# Patient Record
Sex: Male | Born: 1995 | Race: White | Hispanic: No | Marital: Single | Smoking: Never smoker
Health system: Southern US, Community
[De-identification: ages and names within clinical notes are randomized; demographics above are authoritative.]

---

## 2018-08-03 ENCOUNTER — Emergency Department (HOSPITAL_COMMUNITY)
Admission: EM | Admit: 2018-08-03 | Discharge: 2018-08-03 | Disposition: A | Discharge status: Home / Self Care | Payer: BC Managed Care – PPO | Attending: Emergency Medicine | Admitting: Emergency Medicine

## 2018-08-03 ENCOUNTER — Encounter (HOSPITAL_COMMUNITY): Payer: Self-pay | Admitting: Emergency Medicine

## 2018-08-03 ENCOUNTER — Other Ambulatory Visit: Payer: Self-pay

## 2018-08-03 ENCOUNTER — Emergency Department (HOSPITAL_COMMUNITY): Payer: BC Managed Care – PPO

## 2018-08-03 DIAGNOSIS — L03213 Periorbital cellulitis: Secondary | ICD-10-CM | POA: Diagnosis not present

## 2018-08-03 DIAGNOSIS — H02843 Edema of right eye, unspecified eyelid: Secondary | ICD-10-CM | POA: Diagnosis present

## 2018-08-03 LAB — COMPREHENSIVE METABOLIC PANEL
ALT: 19 U/L (ref 0–44)
AST: 22 U/L (ref 15–41)
Albumin: 4.2 g/dL (ref 3.5–5.0)
Alkaline Phosphatase: 59 U/L (ref 38–126)
Anion gap: 8 (ref 5–15)
BUN: 14 mg/dL (ref 6–20)
CO2: 23 mmol/L (ref 22–32)
Calcium: 8.9 mg/dL (ref 8.9–10.3)
Chloride: 107 mmol/L (ref 98–111)
Creatinine, Ser: 0.75 mg/dL (ref 0.61–1.24)
GFR calc Af Amer: >60 mL/min (ref >60)
GFR calc non Af Amer: >60 mL/min (ref >60)
Glucose, Bld: 93 mg/dL (ref 70–99)
Potassium: 4.5 mmol/L (ref 3.5–5.1)
Sodium: 138 mmol/L (ref 135–145)
Total Bilirubin: 0.7 mg/dL (ref 0.3–1.2)
Total Protein: 6.7 g/dL (ref 6.5–8.1)

## 2018-08-03 LAB — CBC WITH DIFFERENTIAL/PLATELET
Abs Immature Granulocytes: 0.01 10*3/uL (ref 0.00–0.07)
Basophils Absolute: 0 10*3/uL (ref 0.0–0.1)
Basophils Relative: 0 %
Eosinophils Absolute: 0.5 10*3/uL (ref 0.0–0.5)
Eosinophils Relative: 9 %
HCT: 45.9 % (ref 39.0–52.0)
Hemoglobin: 15.2 g/dL (ref 13.0–17.0)
Immature Granulocytes: 0 %
Lymphocytes Relative: 37 %
Lymphs Abs: 2.1 10*3/uL (ref 0.7–4.0)
MCH: 28.3 pg (ref 26.0–34.0)
MCHC: 33.1 g/dL (ref 30.0–36.0)
MCV: 85.5 fL (ref 80.0–100.0)
Monocytes Absolute: 0.4 10*3/uL (ref 0.1–1.0)
Monocytes Relative: 8 %
Neutro Abs: 2.7 10*3/uL (ref 1.7–7.7)
Neutrophils Relative %: 46 %
Platelets: 259 10*3/uL (ref 150–400)
RBC: 5.37 MIL/uL (ref 4.22–5.81)
RDW: 12.4 % (ref 11.5–15.5)
WBC: 5.8 10*3/uL (ref 4.0–10.5)
nRBC: 0 % (ref 0.0–0.2)

## 2018-08-03 MED ORDER — FLUORESCEIN SODIUM 1 MG OP STRP
1.0000 | ORAL_STRIP | Freq: Once | OPHTHALMIC | Status: AC
  Administered 2018-08-03: 1 via OPHTHALMIC
  Filled 2018-08-03: qty 1

## 2018-08-03 MED ORDER — CEPHALEXIN 500 MG PO CAPS: 500.0000 mg | ORAL_CAPSULE | Freq: Two times a day (BID) | ORAL | 0 refills | Status: AC

## 2018-08-03 MED ORDER — TETRACAINE HCL 0.5 % OP SOLN
1.0000 [drp] | Freq: Once | OPHTHALMIC | Status: AC
  Administered 2018-08-03: 1 [drp] via OPHTHALMIC
  Filled 2018-08-03: qty 4

## 2018-08-03 MED ORDER — IOHEXOL 300 MG/ML  SOLN
75.0000 mL | Freq: Once | INTRAMUSCULAR | Status: AC | PRN
  Administered 2018-08-03: 75 mL via INTRAVENOUS

## 2018-08-03 MED ORDER — SULFAMETHOXAZOLE-TRIMETHOPRIM 800-160 MG PO TABS: 1.0000 | ORAL_TABLET | Freq: Two times a day (BID) | ORAL | 0 refills | Status: AC

## 2018-08-03 NOTE — Discharge Instructions (Signed)
Your CT showed periorbital celulitis. I have prescribed antibiotics to help with your infection, these are 2 different classes please take them as prescribed.  If you experience any worsening symptoms, worsening swelling, decrease in vision please return to the emergency department.  Follow-up with your primary care physician in the upcoming week for reevaluation.

## 2018-08-03 NOTE — ED Notes (Signed)
Patient transported to CT 

## 2018-08-03 NOTE — ED Provider Notes (Signed)
[facility] EMERGENCY DEPARTMENT Provider Note   CSN: [id number] Arrival date & time: 08/03/18  6301    History   Chief Complaint No chief complaint on file.   HPI Jim Mills is a 23 y.o. male.     22 y.o male with no PMH presents to the ED with a chief complaint of right eye swelling x 2 days.  Patient reports he noted itching to his right eye on  Wednesday, proceeded to scratch his eye and by Friday his eye had swelling significantly.he has taken Aleve to help with his symptoms without improvement.  Patient reports having a difficulty opening his right eye, some decrease in vision along with some photophobia.  He denies any drainage from his right eye.         Home Medications    Prior to Admission medications   Medication Sig Start Date End Date Taking? Authorizing Provider  cephALEXin (KEFLEX) 500 MG capsule Take 1 capsule (500 mg total) by mouth 2 (two) times daily for 7 days. 08/03/18 08/10/18  Janeece Fitting, PA-C  sulfamethoxazole-trimethoprim (BACTRIM DS) 800-160 MG tablet Take 1 tablet by mouth 2 (two) times daily for 7 days. 08/03/18 08/10/18  Janeece Fitting, PA-C    Family History No family history on file.  Social History Social History   Tobacco Use   Smoking status: Not on file  Substance Use Topics   Alcohol use: Not on file   Drug use: Not on file     Allergies   Patient has no allergy information on record.   Review of Systems Review of Systems  Constitutional: Negative for fever.  Eyes: Positive for pain and itching. Negative for photophobia, discharge and redness.     Physical Exam Updated Vital Signs BP 127/70 (BP Location: Right Arm)    Pulse 68    Temp 98.1 F (36.7 C) (Oral)    Resp 18    Ht 6' (1.829 m)    Wt 95.3 kg    SpO2 96%    BMI 28.48 kg/m   Physical Exam Vitals signs and nursing note reviewed.  Constitutional:      Appearance: He is well-developed.  HENT:     Head: Normocephalic and atraumatic.    Eyes:     General: No scleral icterus.    Pupils: Pupils are equal, round, and reactive to light. Pupils are equal.     Right eye: Pupil is round. No corneal abrasion or fluorescein uptake.     Left eye: Pupil is round. No corneal abrasion or fluorescein uptake.     Slit lamp exam:    Right eye: Photophobia present. No corneal flare, corneal ulcer, foreign body or hyphema.     Left eye: No corneal flare, corneal ulcer, foreign body or hyphema.     Comments: Swelling noted to right eye, patient unable to open her right eye.  Please see photos attached.  Fluorescein staining revealed no dendritic lesions to the right eye.  Neck:     Musculoskeletal: Normal range of motion.  Cardiovascular:     Heart sounds: Normal heart sounds.  Pulmonary:     Effort: Pulmonary effort is normal.     Breath sounds: Normal breath sounds. No wheezing.  Chest:     Chest wall: No tenderness.  Abdominal:     General: Bowel sounds are normal. There is no distension.     Palpations: Abdomen is soft.     Tenderness: There is no abdominal tenderness.  Musculoskeletal:  General: No tenderness or deformity.  Skin:    General: Skin is warm and dry.  Neurological:     Mental Status: He is alert and oriented to person, place, and time.           Visual Acuity  Right Eye Distance: 10/12.5 Left Eye Distance: 10/10 Bilateral Distance: 10/12.5  Right Eye Near:   Left Eye Near:    Bilateral Near:     ED Treatments / Results  Labs (all labs ordered are listed, but only abnormal results are displayed) Labs Reviewed  CBC WITH DIFFERENTIAL/PLATELET  COMPREHENSIVE METABOLIC PANEL    EKG None  Radiology Ct Maxillofacial W Contrast  Result Date: 08/03/2018 CLINICAL DATA:  Cellulitis right orbit.  Soft tissue swelling EXAM: CT MAXILLOFACIAL WITH CONTRAST TECHNIQUE: Multidetector CT imaging of the maxillofacial structures was performed with intravenous contrast. Multiplanar CT image  reconstructions were also generated. CONTRAST:  75mL OMNIPAQUE IOHEXOL 300 MG/ML  SOLN COMPARISON:  None. FINDINGS: Osseous: Negative for fracture or mass.  No bony destruction. Orbits: Mild periorbital swelling on the right. No soft tissue abscess. No orbital edema or mass. Normal left orbit. Sinuses: Mild mucosal edema throughout the paranasal sinuses without air-fluid level. Soft tissues: Mild right periorbital swelling otherwise normal soft tissues. Limited intracranial: Negative IMPRESSION: Mild right periorbital cellulitis without soft tissue abscess. No edema within the orbit. Mild mucosal edema paranasal sinuses. Electronically Signed   By: Marlan Palauharles  Clark M.D.   On: 08/03/2018 10:55    Procedures Procedures (including critical care time)  Medications Ordered in ED Medications  tetracaine (PONTOCAINE) 0.5 % ophthalmic solution 1 drop (has no administration in time range)  fluorescein ophthalmic strip 1 strip (has no administration in time range)  iohexol (OMNIPAQUE) 300 MG/ML solution 75 mL (75 mLs Intravenous Contrast Given 08/03/18 1016)     Initial Impression / Assessment and Plan / ED Course  I have reviewed the triage vital signs and the nursing notes.  Pertinent labs & imaging results that were available during my care of the patient were reviewed by me and considered in my medical decision making (see chart for details).      Patient with no past medical history presents to the ED with complaints of right eye swelling which began on Wednesday after severe itching to his right eye.  Reports worsening swelling to the right eye since Wednesday.  Denies any insect bite, fevers, drainage from his eye.  During initial evaluation patient is well-appearing, significant swelling noted to the right eye, patient unable to open his right eye due to swelling.  Will obtain visual acuity along with fluorescein exam.  Some suspicion for periorbital cellulitis versus orbital cellulitis will obtain  CT imaging to further evaluate.  CMP showed no electrolyte abnormality, creatinine level is within normal limits.  CBC showed no leukocytosis, hemoglobin is unremarkable. CT maxillofacial showed: Mild right periorbital cellulitis without soft tissue abscess. No  edema within the orbit.    Mild mucosal edema paranasal sinuses.     Fluorescein evaluation of the right eye revealed no fluorescein uptake, no dendritic lesions low suspicion for any herpes ophthalmicus.  Tono-Pen pressure at 20 within normal limits compared to the left.  Patient will be discharged home with 2 sets of antibiotic therapy Bactrim along with Keflex.  He is to return for reevaluation if symptoms worsen, otherwise follow-up with PCP.  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  Return precautions provided  at length. I have discussed this patient with my attending Dr. Rodena MedinMessick who has not seen the patient but agrees with management at this time.   Portions of this note were generated with Scientist, clinical (histocompatibility and immunogenetics)Dragon dictation software. Dictation errors may occur despite best attempts at proofreading.    Final Clinical Impressions(s) / ED Diagnoses   Final diagnoses:  Periorbital cellulitis of right eye    ED Discharge Orders         Ordered    sulfamethoxazole-trimethoprim (BACTRIM DS) 800-160 MG tablet  2 times daily     08/03/18 1124    cephALEXin (KEFLEX) 500 MG capsule  2 times daily     08/03/18 1124           Claude MangesSoto, Shikira Folino, PA-C 08/03/18 1128    Wynetta FinesMessick, Peter C, MD 08/04/18 [phone]

## 2018-08-03 NOTE — ED Triage Notes (Signed)
Pt states his eye became itchy Wednesday. Pt rubbed it and on Friday it became to swell. Woke up this am and was unable to open eye. Denies drainage.

## 2018-09-19 ENCOUNTER — Emergency Department (HOSPITAL_COMMUNITY)
Admission: EM | Admit: 2018-09-19 | Discharge: 2018-09-19 | Disposition: A | Discharge status: Home / Self Care | Payer: BC Managed Care – PPO | Attending: Emergency Medicine | Admitting: Emergency Medicine

## 2018-09-19 ENCOUNTER — Other Ambulatory Visit: Payer: Self-pay

## 2018-09-19 DIAGNOSIS — R21 Rash and other nonspecific skin eruption: Secondary | ICD-10-CM | POA: Insufficient documentation

## 2018-09-19 DIAGNOSIS — Z5321 Procedure and treatment not carried out due to patient leaving prior to being seen by health care provider: Secondary | ICD-10-CM | POA: Insufficient documentation

## 2018-09-19 NOTE — ED Triage Notes (Signed)
Per pt he has been having itching and swelling that started yesterday. Today he woke up swelling to both eyes and red. Pt said happen a year ago and was given something and went away. Pt has no problem swallowing or difficulty breathing

## 2019-10-01 ENCOUNTER — Other Ambulatory Visit: Payer: Self-pay

## 2019-10-01 ENCOUNTER — Emergency Department (HOSPITAL_COMMUNITY): Payer: BC Managed Care – PPO

## 2019-10-01 ENCOUNTER — Encounter (HOSPITAL_COMMUNITY): Payer: Self-pay | Admitting: Emergency Medicine

## 2019-10-01 ENCOUNTER — Emergency Department (HOSPITAL_COMMUNITY)
Admission: EM | Admit: 2019-10-01 | Discharge: 2019-10-01 | Disposition: A | Discharge status: Home / Self Care | Payer: BC Managed Care – PPO | Attending: Emergency Medicine | Admitting: Emergency Medicine

## 2019-10-01 DIAGNOSIS — X58XXXA Exposure to other specified factors, initial encounter: Secondary | ICD-10-CM | POA: Insufficient documentation

## 2019-10-01 DIAGNOSIS — Y939 Activity, unspecified: Secondary | ICD-10-CM | POA: Diagnosis not present

## 2019-10-01 DIAGNOSIS — S60031A Contusion of right middle finger without damage to nail, initial encounter: Secondary | ICD-10-CM | POA: Insufficient documentation

## 2019-10-01 DIAGNOSIS — S60949A Unspecified superficial injury of unspecified finger, initial encounter: Secondary | ICD-10-CM | POA: Diagnosis present

## 2019-10-01 DIAGNOSIS — Y999 Unspecified external cause status: Secondary | ICD-10-CM | POA: Diagnosis not present

## 2019-10-01 DIAGNOSIS — Y929 Unspecified place or not applicable: Secondary | ICD-10-CM | POA: Diagnosis not present

## 2019-10-01 NOTE — ED Triage Notes (Signed)
Patient states he accidentally shut his right middle finger in his dresser drawer an hour ago.

## 2019-10-01 NOTE — Discharge Instructions (Signed)
Recommend ibuprofen, cool compresses to the injured finger. Follow up with your doctor or any Urgent Care if symptoms worsen.

## 2019-10-01 NOTE — ED Provider Notes (Signed)
[facility]-EMERGENCY DEPT Provider Note   CSN: [id number] Arrival date & time: 10/01/19  0017     History Chief Complaint  Patient presents with   Finger Injury    Jim Mills is a 24 y.o. male.  Patient caught his right hand in a closing drawer last evening (09/30/19) and presents for evaluation of swelling and pain to right middle finger. No other injury.   The history is provided by the patient. No language interpreter was used.       History reviewed. No pertinent past medical history.  There are no problems to display for this patient.   History reviewed. No pertinent surgical history.     History reviewed. No pertinent family history.  Social History   Tobacco Use   Smoking status: Never Smoker   Smokeless tobacco: Never Used  Vaping Use   Vaping Use: Never used  Substance Use Topics   Alcohol use: Never   Drug use: Never    Home Medications Prior to Admission medications   Not on File    Allergies    Patient has no known allergies.  Review of Systems   Review of Systems  Musculoskeletal:       See HPI.  Skin: Negative for color change and wound.  Neurological: Negative for numbness.    Physical Exam Updated Vital Signs BP (!) 145/77 (BP Location: Right Arm)    Pulse 68    Temp 98 F (36.7 C) (Oral)    Resp 15    Ht 6' (1.829 m)    Wt 97.5 kg    SpO2 100%    BMI 29.16 kg/m   Physical Exam Constitutional:      Appearance: He is well-developed.  Pulmonary:     Effort: Pulmonary effort is normal.  Musculoskeletal:        General: Normal range of motion.     Cervical back: Normal range of motion.     Comments: Right hand without swelling, bony deformity or discoloration. FROM with full strength of all joints of 3rd finger.   Skin:    General: Skin is warm and dry.  Neurological:     Mental Status: He is alert and oriented to person, place, and time.     ED Results / Procedures / Treatments   Labs (all  labs ordered are listed, but only abnormal results are displayed) Labs Reviewed - No data to display  EKG None  Radiology DG Finger Middle Right  Result Date: 10/01/2019 CLINICAL DATA:  Middle finger pain after injury. Shut finger in drawer this evening. Pain and swelling. Bruising. EXAM: RIGHT MIDDLE FINGER 2+V COMPARISON:  None. FINDINGS: There is no evidence of fracture or dislocation. Normal alignment and joint spaces. No periosteal reaction or focal bone abnormality. There is mild generalized soft tissue edema. No soft tissue air or radiopaque foreign body. IMPRESSION: Mild generalized soft tissue edema. No fracture or dislocation. Electronically Signed   By: Narda Rutherford M.D.   On: 10/01/2019 00:47    Procedures Procedures (including critical care time)  Medications Ordered in ED Medications - No data to display  ED Course  I have reviewed the triage vital signs and the nursing notes.  Pertinent labs & imaging results that were available during my care of the patient were reviewed by me and considered in my medical decision making (see chart for details).    MDM Rules/Calculators/A&P  Right 3rd finger injury caused by closing drawer. Imaging negative. No tendon deficits on exam.   Final Clinical Impression(s) / ED Diagnoses Final diagnoses:  None   1. Right 3rd finger contusion.  Rx / DC Orders ED Discharge Orders    None       Elpidio Anis, PA-C 10/01/19 0424    Shon Baton, MD 10/01/19 [phone]

## 2019-12-02 IMAGING — CT CT MAXILLOFACIAL WITH CONTRAST
3 series · 16 of 47 positions shown, 19 images · IV contrast (Omni 300)
Comparison: None.

CLINICAL DATA: Cellulitis right orbit.  Soft tissue swelling

EXAM:
CT MAXILLOFACIAL WITH CONTRAST
TECHNIQUE: Multidetector CT imaging of the maxillofacial structures was
performed with intravenous contrast. Multiplanar CT image
reconstructions were also generated.
CONTRAST:  75mL OMNIPAQUE IOHEXOL 300 MG/ML  SOLN

[Series 3: facialbone 2.0 st · axial · 0.31mm/px · z∈[-156,-4]mm · 10 of 90 slices shown, 13 images]
[im 7/90  brain]
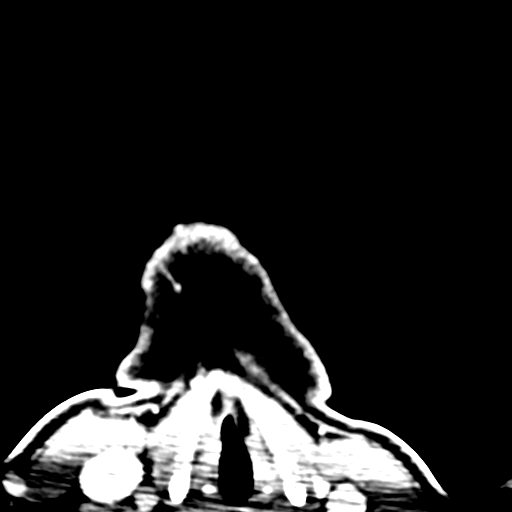
[im 7/90  bone]
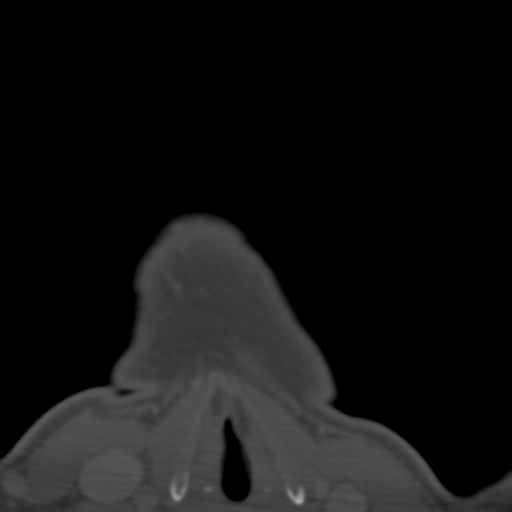
[im 16/90  bone]
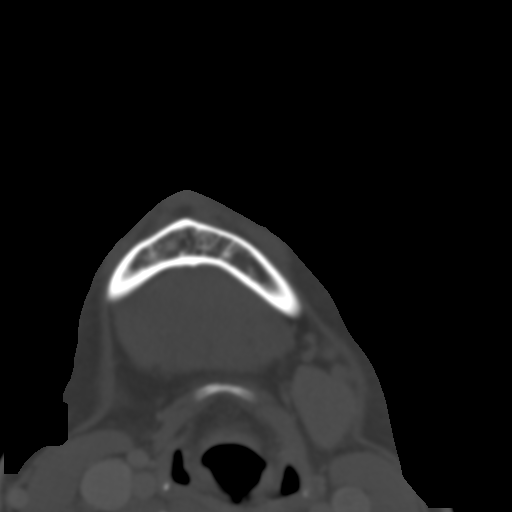
[im 25/90  bone]
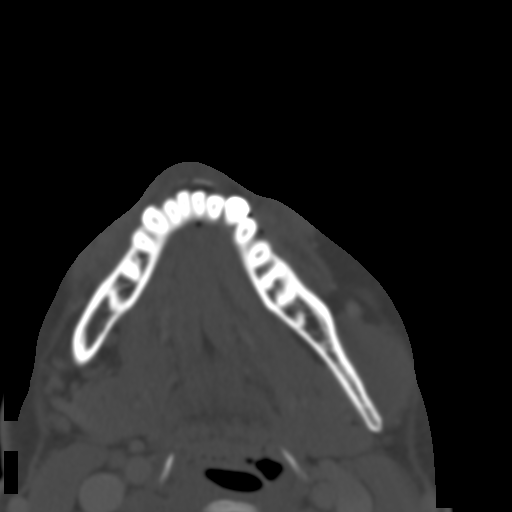
[im 31/90  bone]
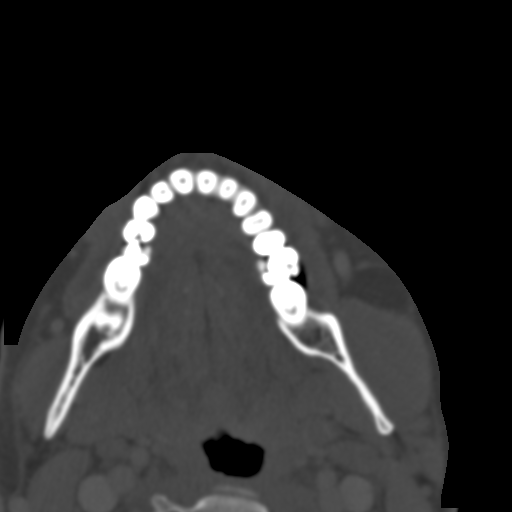
[im 40/90  brain]
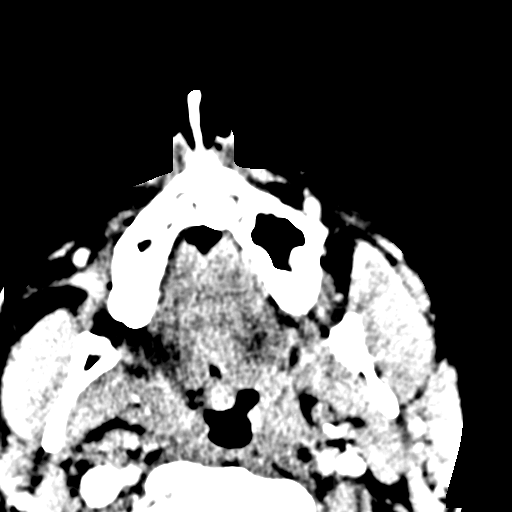
[im 40/90  bone]
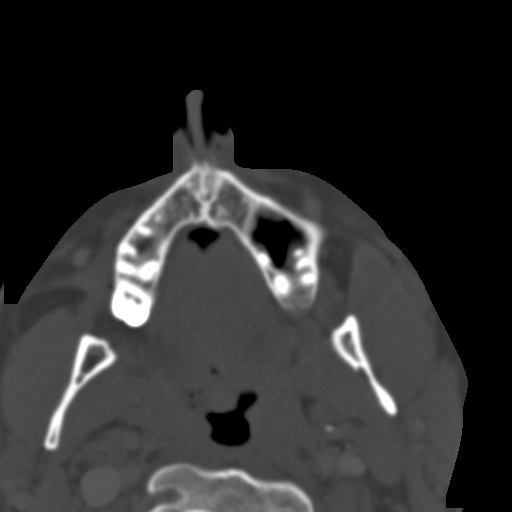
[im 50/90  bone]
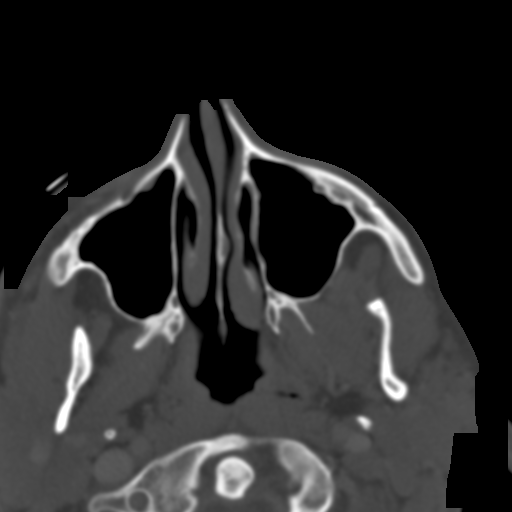
[im 59/90  bone]
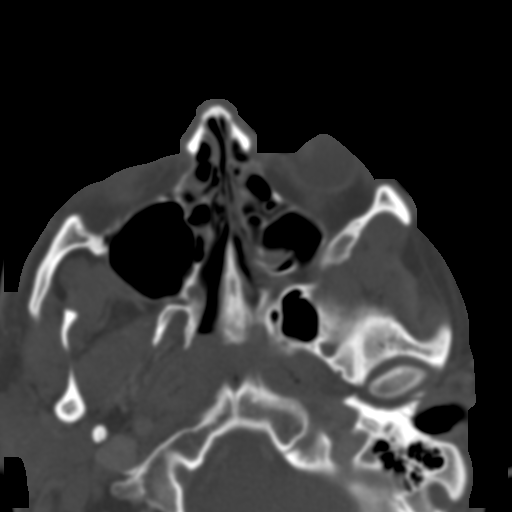
[im 68/90  bone]
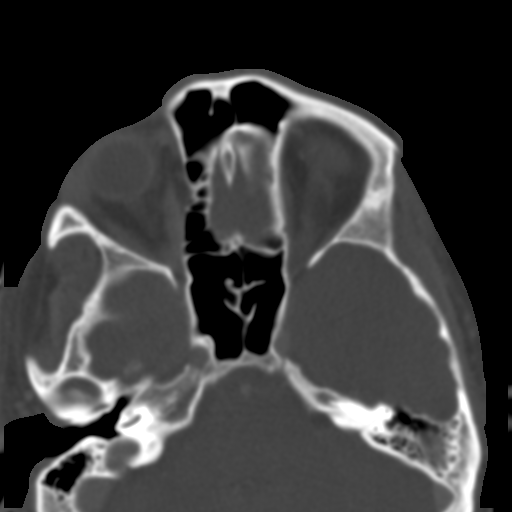
[im 74/90  brain]
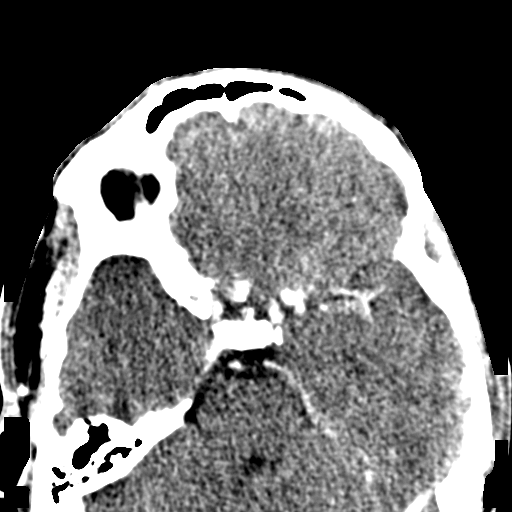
[im 74/90  bone]
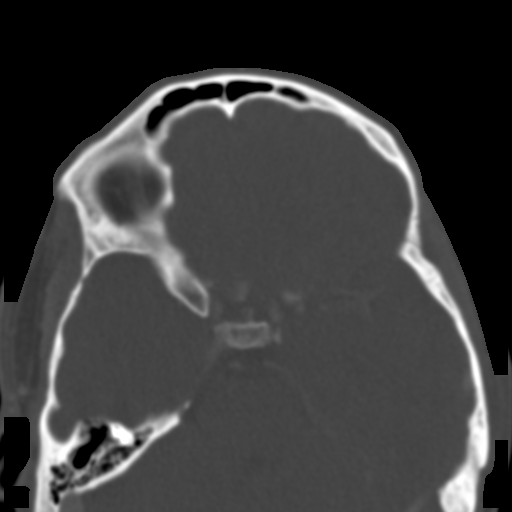
[im 83/90  bone]
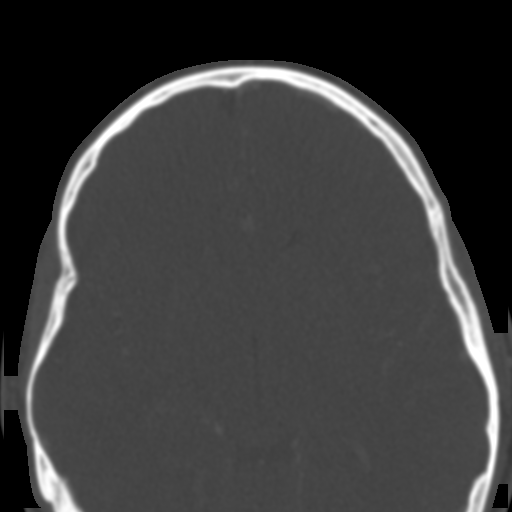

[Series 7: facialbone 2.0 cor st · coronal · 0.35mm/px · 3 of 76 slices shown]
[im 26/76  bone]
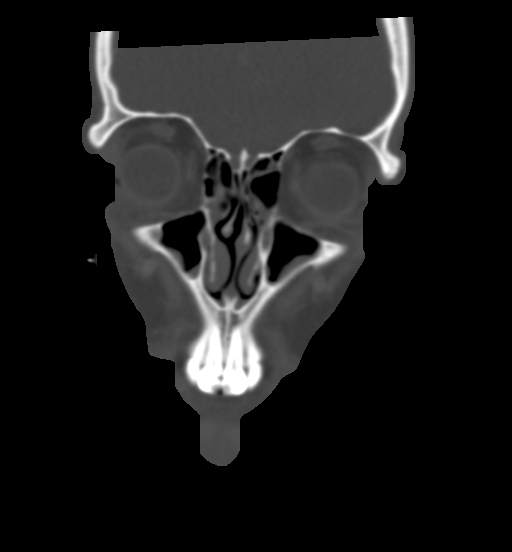
[im 34/76  bone]
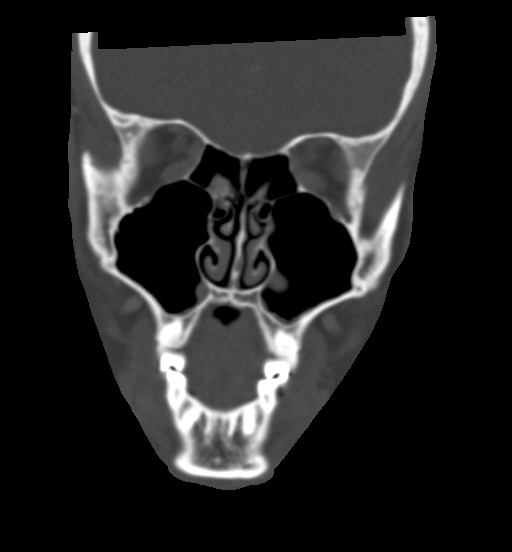
[im 42/76  bone]
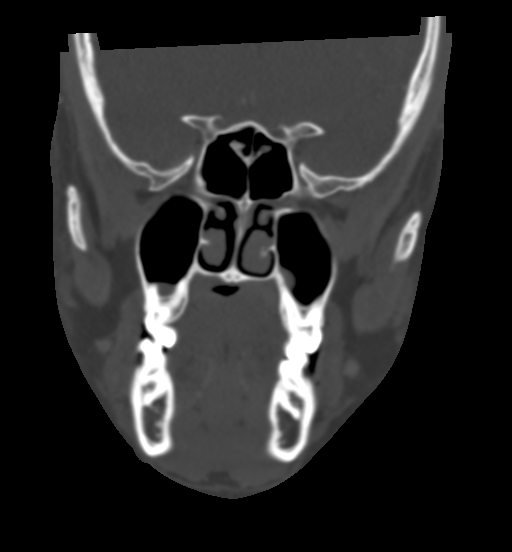

[Series 8: facialbone 2.0 sag st · sagittal · 0.35mm/px · 3 of 98 slices shown]
[im 33/98  bone]
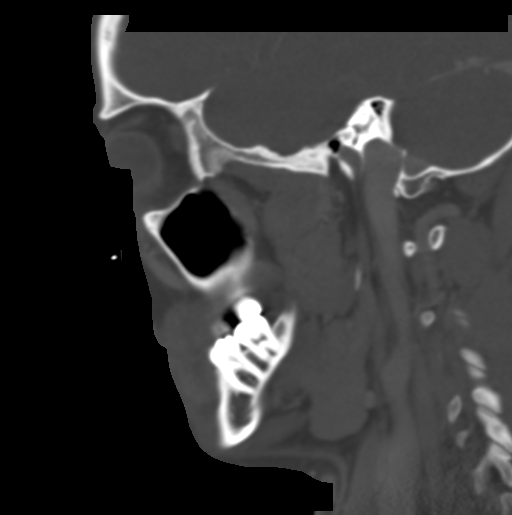
[im 49/98  bone]
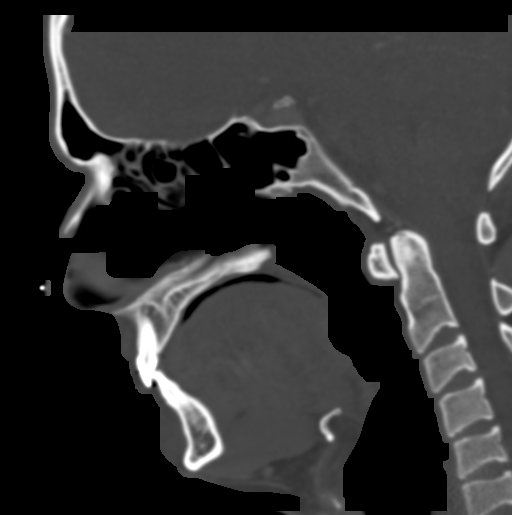
[im 65/98  bone]
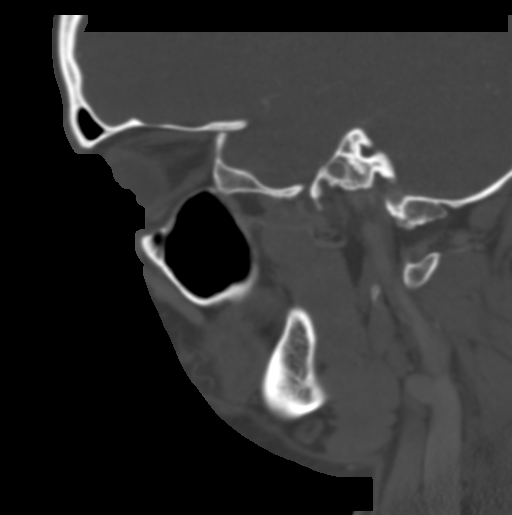

[16 of 47 positions shown; findings below may reference images not displayed]

FINDINGS: Osseous: Negative for fracture or mass.  No bony destruction.

Orbits: Mild periorbital swelling on the right. No soft tissue
abscess. No orbital edema or mass. Normal left orbit.

Sinuses: Mild mucosal edema throughout the paranasal sinuses without
air-fluid level.

Soft tissues: Mild right periorbital swelling otherwise normal soft
tissues.

Limited intracranial: Negative
IMPRESSION: Mild right periorbital cellulitis without soft tissue abscess. No
edema within the orbit.

Mild mucosal edema paranasal sinuses.

## 2021-01-29 IMAGING — CR DG FINGER MIDDLE 2+V*R*
3 series · 3 of 3 positions shown · non-contrast
Comparison: None.

CLINICAL DATA: Middle finger pain after injury. Shut finger in
drawer this evening. Pain and swelling. Bruising.

EXAM:
RIGHT MIDDLE FINGER 2+V

[x finger pa right]
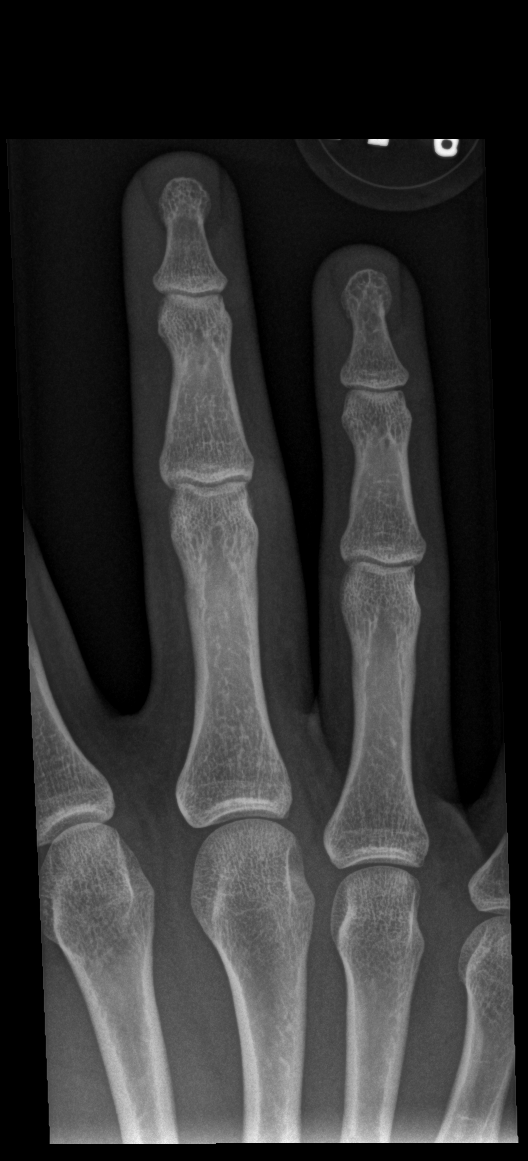

[x finger obl right]
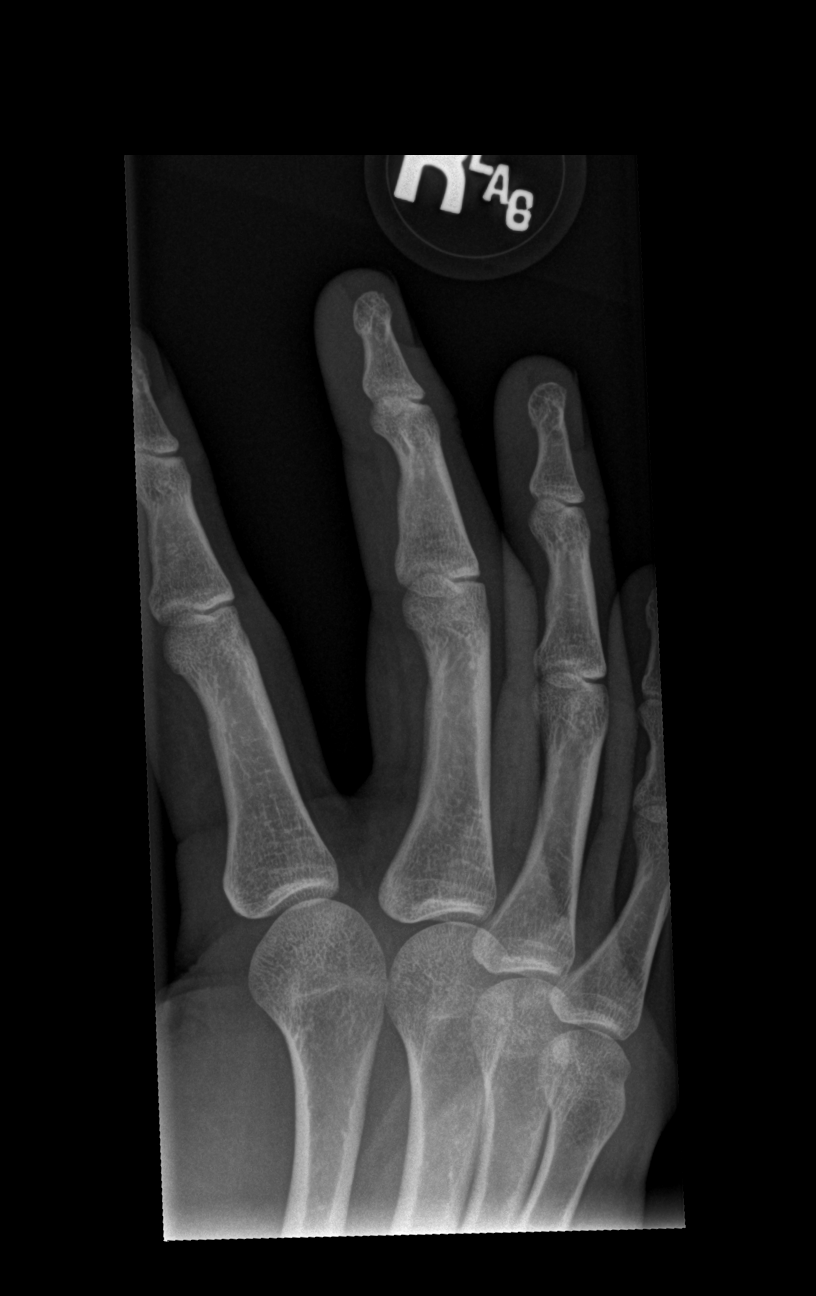

[x finger lat right]
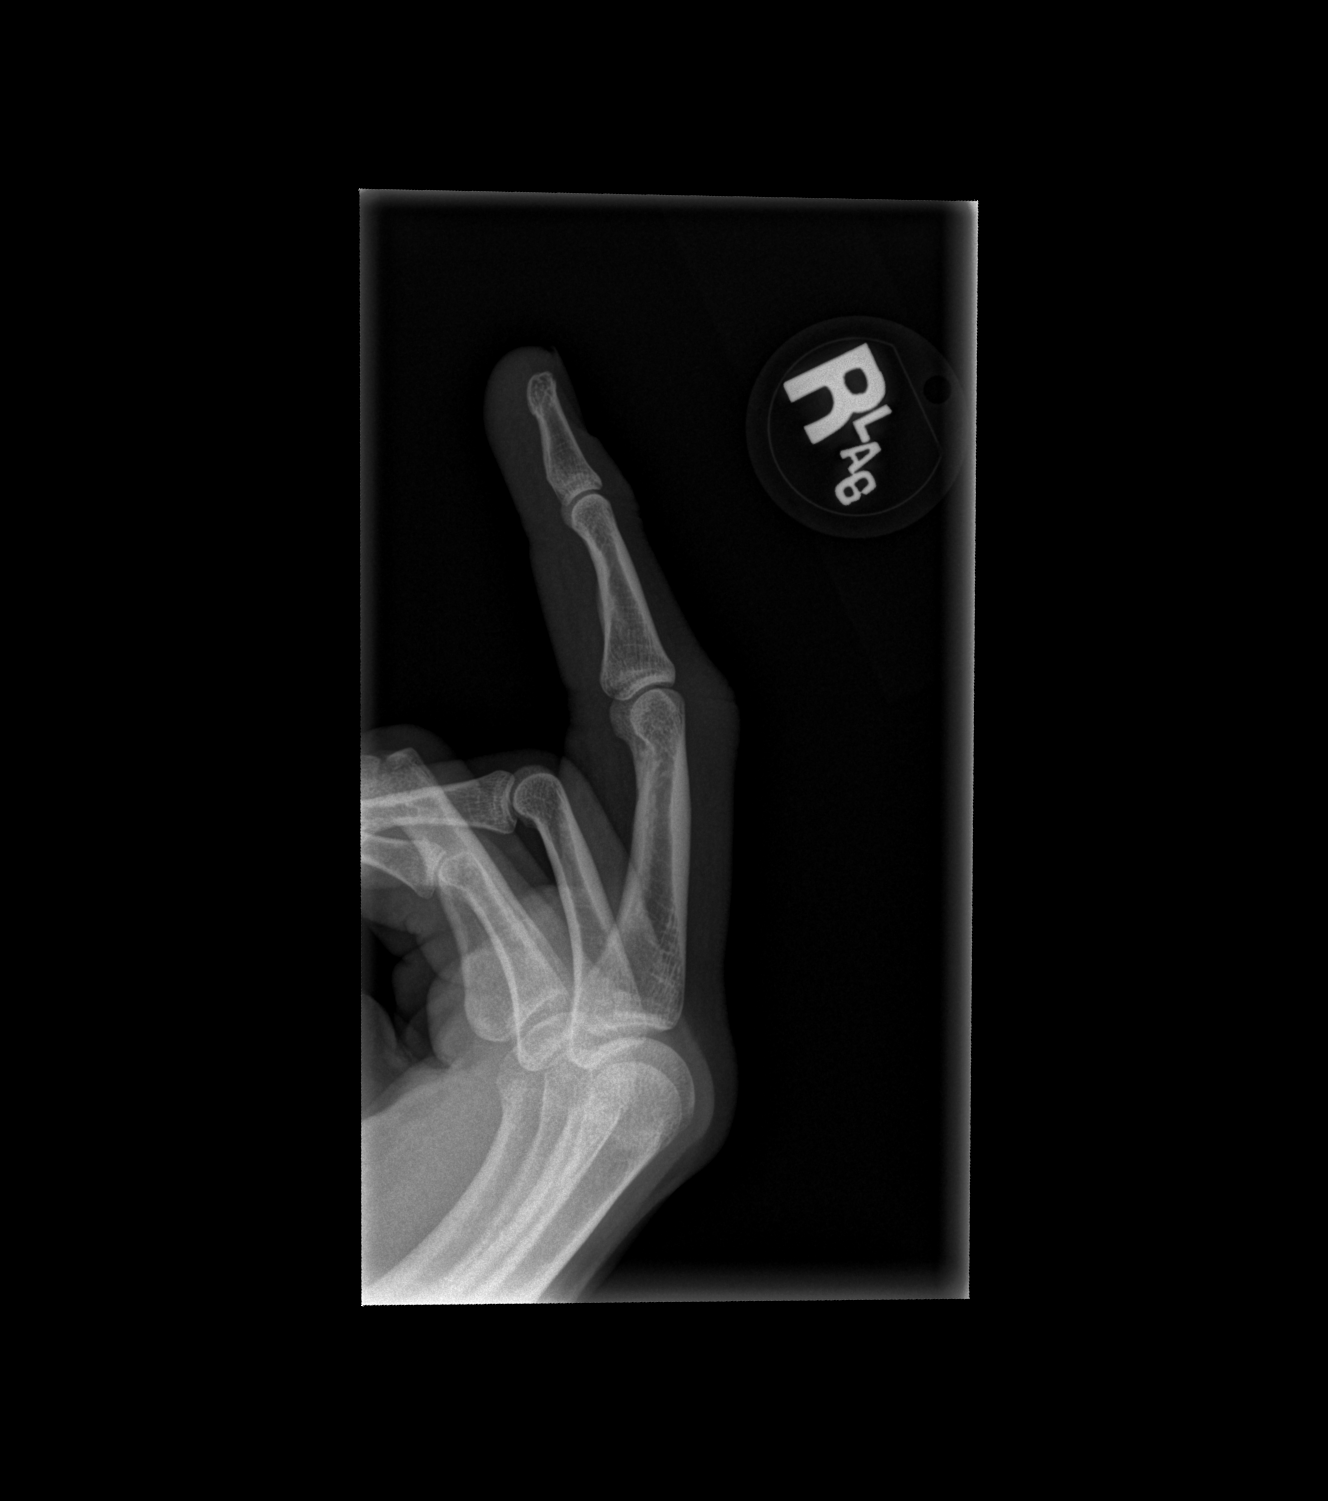

[3 of 3 positions shown; findings below may reference images not displayed]

FINDINGS: There is no evidence of fracture or dislocation. Normal alignment
and joint spaces. No periosteal reaction or focal bone abnormality.
There is mild generalized soft tissue edema. No soft tissue air or
radiopaque foreign body.
IMPRESSION: Mild generalized soft tissue edema. No fracture or dislocation.
